# Patient Record
Sex: Female | Born: 1960 | Race: White | Hispanic: No | Marital: Single
Health system: Southern US, Community
[De-identification: ages and names within clinical notes are randomized; demographics above are authoritative.]

---

## 2021-05-03 ENCOUNTER — Emergency Department (HOSPITAL_COMMUNITY): Payer: 59

## 2021-05-03 ENCOUNTER — Other Ambulatory Visit: Payer: Self-pay

## 2021-05-03 ENCOUNTER — Emergency Department (HOSPITAL_COMMUNITY)
Admission: EM | Admit: 2021-05-03 | Discharge: 2021-05-03 | Disposition: A | Discharge status: Home / Self Care | Payer: 59 | Attending: Emergency Medicine | Admitting: Emergency Medicine

## 2021-05-03 ENCOUNTER — Encounter (HOSPITAL_COMMUNITY): Payer: Self-pay | Admitting: *Deleted

## 2021-05-03 DIAGNOSIS — R1012 Left upper quadrant pain: Secondary | ICD-10-CM | POA: Diagnosis present

## 2021-05-03 DIAGNOSIS — R112 Nausea with vomiting, unspecified: Secondary | ICD-10-CM

## 2021-05-03 DIAGNOSIS — K449 Diaphragmatic hernia without obstruction or gangrene: Secondary | ICD-10-CM | POA: Diagnosis not present

## 2021-05-03 DIAGNOSIS — R109 Unspecified abdominal pain: Secondary | ICD-10-CM

## 2021-05-03 LAB — COMPREHENSIVE METABOLIC PANEL
ALT: 37 U/L (ref 0–44)
AST: 27 U/L (ref 15–41)
Albumin: 4.1 g/dL (ref 3.5–5.0)
Alkaline Phosphatase: 79 U/L (ref 38–126)
Anion gap: 7 (ref 5–15)
BUN: 7 mg/dL (ref 6–20)
CO2: 28 mmol/L (ref 22–32)
Calcium: 9.4 mg/dL (ref 8.9–10.3)
Chloride: 105 mmol/L (ref 98–111)
Creatinine, Ser: 0.81 mg/dL (ref 0.44–1.00)
GFR, Estimated: >60 mL/min (ref >60)
Glucose, Bld: 113 mg/dL — ABNORMAL HIGH (ref 70–99)
Potassium: 3.8 mmol/L (ref 3.5–5.1)
Sodium: 140 mmol/L (ref 135–145)
Total Bilirubin: 0.5 mg/dL (ref 0.3–1.2)
Total Protein: 7.4 g/dL (ref 6.5–8.1)

## 2021-05-03 LAB — CBC WITH DIFFERENTIAL/PLATELET
Abs Immature Granulocytes: 0.02 10*3/uL (ref 0.00–0.07)
Basophils Absolute: 0 10*3/uL (ref 0.0–0.1)
Basophils Relative: 0 %
Eosinophils Absolute: 0.1 10*3/uL (ref 0.0–0.5)
Eosinophils Relative: 1 %
HCT: 39.6 % (ref 36.0–46.0)
Hemoglobin: 12.5 g/dL (ref 12.0–15.0)
Immature Granulocytes: 1 %
Lymphocytes Relative: 19 %
Lymphs Abs: 0.7 10*3/uL (ref 0.7–4.0)
MCH: 27 pg (ref 26.0–34.0)
MCHC: 31.6 g/dL (ref 30.0–36.0)
MCV: 85.5 fL (ref 80.0–100.0)
Monocytes Absolute: 0.4 10*3/uL (ref 0.1–1.0)
Monocytes Relative: 11 %
Neutro Abs: 2.6 10*3/uL (ref 1.7–7.7)
Neutrophils Relative %: 68 %
Platelets: 189 10*3/uL (ref 150–400)
RBC: 4.63 MIL/uL (ref 3.87–5.11)
RDW: 14 % (ref 11.5–15.5)
WBC: 3.8 10*3/uL — ABNORMAL LOW (ref 4.0–10.5)
nRBC: 0 % (ref 0.0–0.2)

## 2021-05-03 LAB — LIPASE, BLOOD: Lipase: 31 U/L (ref 11–51)

## 2021-05-03 MED ORDER — ONDANSETRON HCL 4 MG/2ML IJ SOLN
4.0000 mg | Freq: Once | INTRAMUSCULAR | Status: AC
  Administered 2021-05-03: 4 mg via INTRAVENOUS
  Filled 2021-05-03: qty 2

## 2021-05-03 MED ORDER — DICYCLOMINE HCL 20 MG PO TABS: 20.0000 mg | ORAL_TABLET | Freq: Two times a day (BID) | ORAL | 0 refills | Status: AC | PRN

## 2021-05-03 MED ORDER — ONDANSETRON HCL 4 MG PO TABS: 4.0000 mg | ORAL_TABLET | Freq: Three times a day (TID) | ORAL | 0 refills | Status: AC | PRN

## 2021-05-03 MED ORDER — MORPHINE SULFATE (PF) 4 MG/ML IV SOLN: 4.0000 mg | Freq: Once | INTRAVENOUS | Status: DC

## 2021-05-03 MED ORDER — FAMOTIDINE 20 MG PO TABS: 20.0000 mg | ORAL_TABLET | Freq: Every day | ORAL | 0 refills | Status: AC

## 2021-05-03 NOTE — Discharge Instructions (Addendum)
Take Pepcid daily to decrease stomach acid and help with your symptoms. You Zofran as needed for nausea or vomiting. Use Bentyl as needed for abdominal spasm or pain. Continue taking Tylenol ibuprofen as needed for pain. If you continue to have symptoms, I recommend you follow-up with your GI doctor for further evaluation.  Return to emergency room if you develop fevers, persistent vomiting, severe worsening pain, or any new, worsening, or concerning symptoms.

## 2021-05-03 NOTE — ED Provider Notes (Signed)
[facility]-EMERGENCY DEPT Provider Note   CSN: [id number] Arrival date & time: 05/03/21  0818     History Chief Complaint  Patient presents with   Abdominal Cramping   Nausea    Samantha Hale is a 60 y.o. female presenting for evaluation of nausea, vomiting, abdominal pain.  Patient states pain is been present for the past couple months, but worse in the past 24 hours.  She has associated nausea with intermittent vomiting.  Pain is constant, with waxing and waning spasm pain.  It is mostly in the left side of her abdomen and radiates to her back.  She reports a history of diverticulitis, and states 2 weeks ago she called her PCP who called in Cipro and Flagyl.  She completed the course of antibiotics about a week ago, but reports no improvement of symptoms.  She denies fevers, chills, chest pain, shortness breath, cough, abnormal urination or bowel movements.  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  She is taking ibuprofen for pain.  She states that she has been traveling between [place] and [place] to help her mom who has dementia, has had increased stress.  Initially, patient was doing packing and lots of heavy lifting, but has not been doing this for several weeks.  Nothing makes her pain better or worse, including positioning or p.o. intake.  No change in appetite or diet recently.  HPI     History reviewed. No pertinent past medical history.  There are no problems to display for this patient.   History reviewed. No pertinent surgical history.   OB History   No obstetric history on file.     No family history on file.     Home Medications Prior to Admission medications   Medication Sig Start Date End Date Taking? Authorizing Provider  dicyclomine (BENTYL) 20 MG tablet Take 1 tablet (20 mg total) by mouth 2 (two) times daily as needed for spasms. 05/03/21  Yes Charmion Hapke, PA-C  famotidine (PEPCID) 20 MG  tablet Take 1 tablet (20 mg total) by mouth daily. 05/03/21  Yes Kurt Azimi, PA-C  ondansetron (ZOFRAN) 4 MG tablet Take 1 tablet (4 mg total) by mouth every 8 (eight) hours as needed for nausea or vomiting. 05/03/21  Yes Naia Ruff, PA-C    Allergies    Patient has no allergy information on record.  Review of Systems   Review of Systems  Gastrointestinal:  Positive for abdominal pain, nausea and vomiting.  Genitourinary:  Positive for flank pain.  All other systems reviewed and are negative.  Physical Exam Updated Vital Signs BP 128/81   Pulse (!) 57   Temp 98.7 F (37.1 C) (Oral)   Resp 18   Ht 5\' 7"  (1.702 m)   Wt 86.2 kg   SpO2 98%   BMI 29.76 kg/m   Physical Exam Vitals and nursing note reviewed.  Constitutional:      General: She is not in acute distress.    Appearance: Normal appearance.     Comments: Nontoxic  HENT:     Head: Normocephalic and atraumatic.  Eyes:     Conjunctiva/sclera: Conjunctivae normal.     Pupils: Pupils are equal, round, and reactive to light.  Cardiovascular:     Rate and Rhythm: Normal rate and regular rhythm.     Pulses: Normal pulses.  Pulmonary:     Effort: Pulmonary effort is normal. No respiratory distress.     Breath sounds: Normal  breath sounds. No wheezing.     Comments: Speaking in full sentences.  Clear lung sounds in all fields. Abdominal:     General: There is no distension.     Palpations: Abdomen is soft. There is no mass.     Tenderness: There is abdominal tenderness. There is left CVA tenderness. There is no guarding or rebound.     Comments: Ttp of L sided abd, mostly LUQ. L sided flank pain.   Musculoskeletal:        General: Normal range of motion.     Cervical back: Normal range of motion and neck supple.  Skin:    General: Skin is warm and dry.     Capillary Refill: Capillary refill takes less than 2 seconds.  Neurological:     Mental Status: She is alert and oriented to person, place, and time.   Psychiatric:        Mood and Affect: Mood and affect normal.        Speech: Speech normal.        Behavior: Behavior normal.    ED Results / Procedures / Treatments   Labs (all labs ordered are listed, but only abnormal results are displayed) Labs Reviewed  COMPREHENSIVE METABOLIC PANEL - Abnormal; Notable for the following components:      Result Value   Glucose, Bld 113 (*)    All other components within normal limits  CBC WITH DIFFERENTIAL/PLATELET - Abnormal; Notable for the following components:   WBC 3.8 (*)    All other components within normal limits  LIPASE, BLOOD  URINALYSIS, ROUTINE W REFLEX MICROSCOPIC    EKG None  Radiology CT Renal Stone Study  Result Date: 05/03/2021 CLINICAL DATA:  60 year old female with left side flank and back pain for 3 months. Nausea. EXAM: CT ABDOMEN AND PELVIS WITHOUT CONTRAST TECHNIQUE: Multidetector CT imaging of the abdomen and pelvis was performed following the standard protocol without IV contrast. COMPARISON:  None. FINDINGS: Lower chest: Small to moderate gastric hiatal hernia. Otherwise negative lower chest. Hepatobiliary: Absent gallbladder.  Negative noncontrast liver. Pancreas: Negative aside from partial fatty atrophy of the pancreas. Spleen: Spleen size is at the upper limits of normal. Otherwise negative noncontrast appearance. Adrenals/Urinary Tract: Normal adrenal glands. Punctate right lower pole nephrolithiasis *CRASH* that punctate right midpole nephrolithiasis on coronal image 73. But no hydronephrosis. No left-side nephrolithiasis. No perinephric stranding. Both ureters are decompressed. There are numerous pelvic phleboliths, but no secondary signs of a distal ureteral calculus. Unremarkable bladder. Stomach/Bowel: Redundant large bowel, but otherwise negative. Normal appendix visible on coronal image 58. Negative terminal ileum. No dilated small bowel. 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 The bypassed stomach and duodenum are diminutive. No free air, free fluid, mesenteric inflammation. Vascular/Lymphatic: Normal caliber abdominal aorta. Mild Aortoiliac calcified atherosclerosis. Reproductive: Noncontrast uterus and right ovary appear negative. Left ovary may be diminutive or absent. Other: No pelvic free fluid. Musculoskeletal: Intermittent advanced disc and endplate degeneration in the spine, including T12-L1 and L3-L4. Mild degenerative retrolisthesis at the latter. No acute osseous abnormality identified. IMPRESSION: 1. Punctate right nephrolithiasis, but no left-side calculus or obstructive uropathy. 2. Small to moderate gastric hiatal hernia. 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 3. Intermittent advanced spinal disc degeneration. Electronically Signed   By: Odessa Fleming M.D.   On: 05/03/2021 10:55    Procedures Procedures   Medications Ordered in ED Medications  morphine 4  MG/ML injection 4 mg (4 mg Intravenous Not Given 05/03/21 1051)  ondansetron (ZOFRAN) injection 4 mg (4 mg Intravenous Given 05/03/21 1008)    ED Course  I have reviewed the triage vital signs and the nursing notes.  Pertinent labs & imaging results that were available during my care of the patient were reviewed by me and considered in my medical decision making (see chart for details).    MDM Rules/Calculators/A&P                          Patient presenting for evaluation nausea, vomiting, abdominal pain.  On exam, patient peers nontoxic.  She does have tenderness palpation the left side abdomen with left flank pain.  Less likely diverticulitis considering length of symptoms, however due to patient's history, will obtain labs and CT.  Also consider kidney stone.  Less likely pyelo without urinary symptoms.  Consider MSK cause.  Will treat symptomatically and reassess.  Labs interpreted by me, overall reassuring.  Very mild  leukopenia of 3.8, nonspecific.  Kidney, liver, pancreatic function normal.  CT shows kidney stone in the right kidney, but no ureterolithiasis.  No hydro to indicate recently passed stone on the left.  Patient does have a small to moderate sized hiatal hernia, which could be contributing to her symptoms.  Discussed findings with patient.  Discussed symptomatic management with Pepcid, Zofran as needed, and Bentyl.  Patient reports nausea is improved after medication, as she was not having any pain at the time she had refused morphine.  I discussed follow-up with her GI doctor for further evaluation and management.  At this time, patient appears safe for discharge.  Return precautions given.  Patient states she understands and agrees to plan  Final Clinical Impression(s) / ED Diagnoses Final diagnoses:  Hiatal hernia  Non-intractable vomiting with nausea, unspecified vomiting type  Abdominal spasms    Rx / DC Orders ED Discharge Orders          Ordered    famotidine (PEPCID) 20 MG tablet  Daily        05/03/21 1125    ondansetron (ZOFRAN) 4 MG tablet  Every 8 hours PRN        05/03/21 1125    dicyclomine (BENTYL) 20 MG tablet  2 times daily PRN        05/03/21 1125             [place] Delauder, PA-C 05/03/21 1135    Bethann Berkshire, MD 05/05/21 0845

## 2021-05-03 NOTE — ED Notes (Signed)
Pt declining pain medication at this time.

## 2021-05-03 NOTE — ED Notes (Signed)
Pt back from CT at this time 

## 2021-05-03 NOTE — ED Provider Notes (Signed)
Emergency Medicine Provider Triage Evaluation Note  Samantha Hale , a 60 y.o. female  was evaluated in triage.  Pt complains of left upper quadrant abdominal pain that sometimes radiates to the left back for the last 2 months.  Became more severe last night and had nausea, dry heaving.  History of diverticulitis.  Her doctor called in antibiotics 3 weeks ago but this did not help.  History of kidney stones  Review of Systems  Positive: Abdominal pain, nausea Negative: Fever, vomiting, diarrhea, dysuria, hematuria  Physical Exam  BP (!) 145/86 (BP Location: Left Arm)   Pulse 68   Temp 98 F (36.7 C) (Oral)   Resp 18   SpO2 100%  Gen:   Awake, no distress  Resp:  Normal effort MSK:   Moves extremities without difficulty Abd:  Mild LUQ and CVA tenderness (distractable)  Medical Decision Making  Medically screening exam initiated at 9:22 AM.  Appropriate orders placed.   Patient made aware this encounter is a triage and screening encounter and no beds are immediately available at this time in the ER.  Patient was informed that the remainder of the evaluation will be completed by another provider.  Patient made aware triage orders have been placed and patient will be placed in the waiting room while work up is initiated and until a room becomes available. Patient encouraged to await a formal ER encounter with a clinician.  Patient made aware that exiting the department prior to formal encounter with an ER clinician and completion of the work-up is considered leaving against medical advice.  At that time there is no guarantee that there are no emergency medical conditions present and patient assumes risks of leaving including worsening condition, permanent disability and death. Patient verbalizes understanding.     Liberty Handy, PA-C 05/03/21 1950    Bethann Berkshire, MD 05/05/21 [phone]

## 2021-05-03 NOTE — ED Triage Notes (Signed)
Pt complains of left side abdominal/back cramps, nausea/emesis, headache x 2 months. Hx of diverticulitis, tried taking abx from PCP w/o relief.

## 2021-05-11 ENCOUNTER — Encounter: Payer: Self-pay | Admitting: Nurse Practitioner

## 2021-06-09 ENCOUNTER — Ambulatory Visit: Payer: 59 | Admitting: Nurse Practitioner

## 2022-10-04 IMAGING — CT CT RENAL STONE PROTOCOL
2 of 4 series · 16 of 46 positions shown, 18 images · non-contrast
Comparison: None.

CLINICAL DATA: 59-year-old female with left side flank and back
pain for 3 months. Nausea.

EXAM:
CT ABDOMEN AND PELVIS WITHOUT CONTRAST
TECHNIQUE: Multidetector CT imaging of the abdomen and pelvis was performed
following the standard protocol without IV contrast.

[Series 2: axial st · axial · 0.75mm/px · z∈[-534,-104]mm · 13 of 98 slices shown, 15 images]
[im 6/98  soft-tissue]
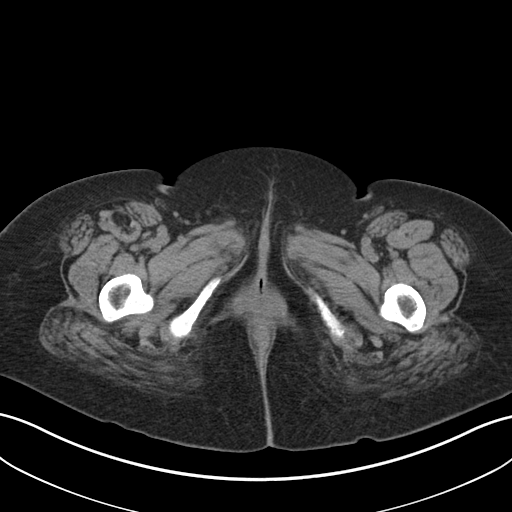
[im 6/98  bone]
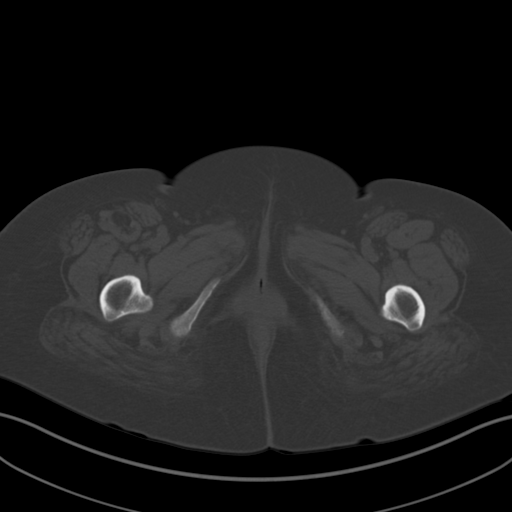
[im 11/98  soft-tissue]
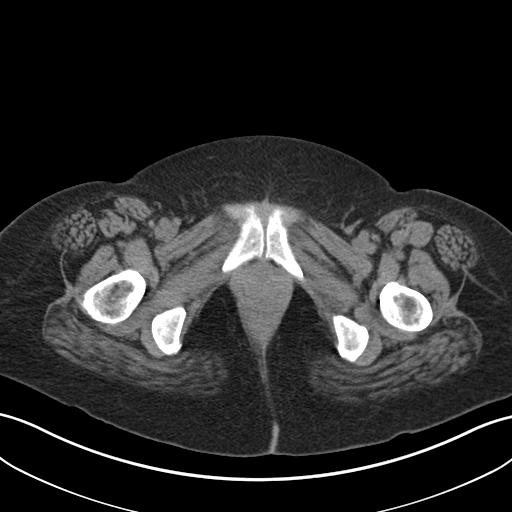
[im 22/98  soft-tissue]
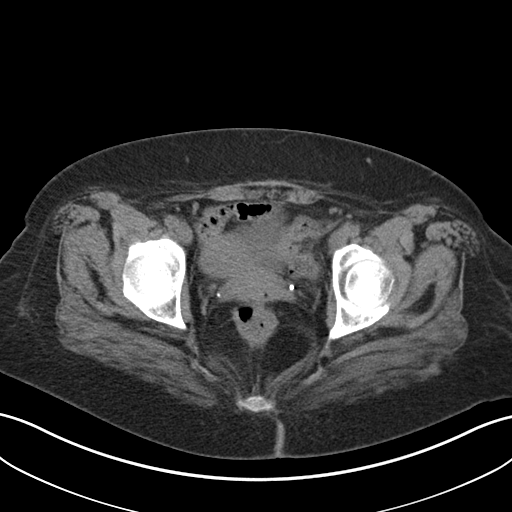
[im 27/98  soft-tissue]
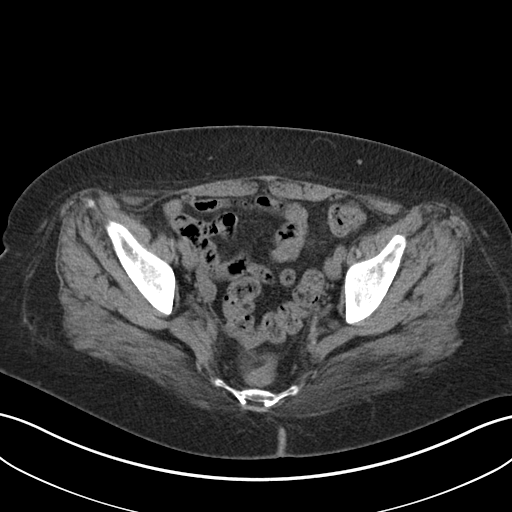
[im 33/98  soft-tissue]
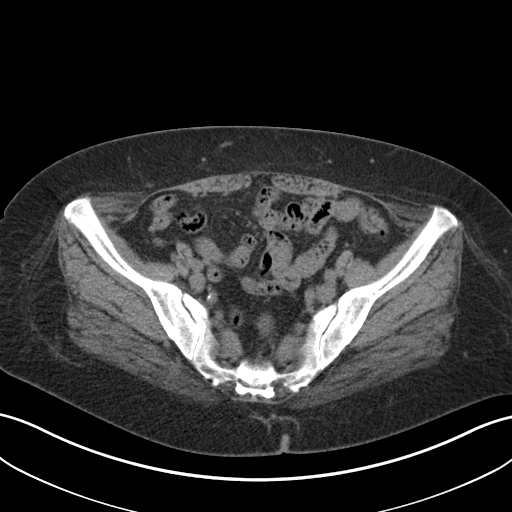
[im 44/98  soft-tissue]
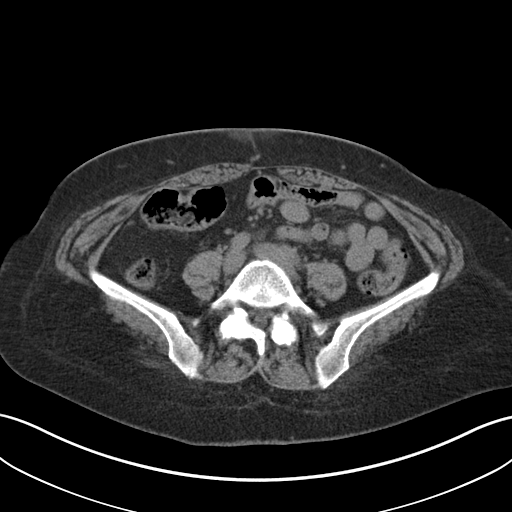
[im 49/98  soft-tissue]
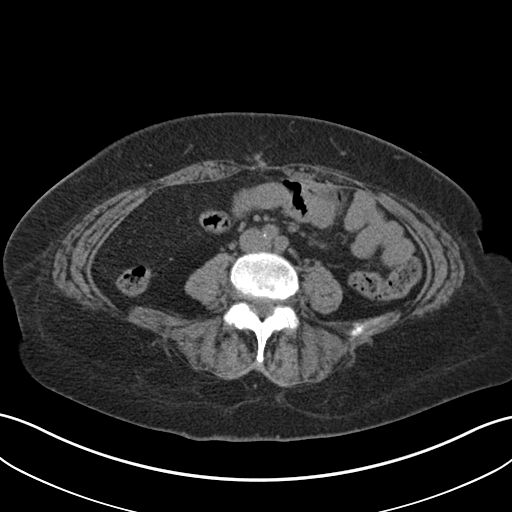
[im 54/98  soft-tissue]
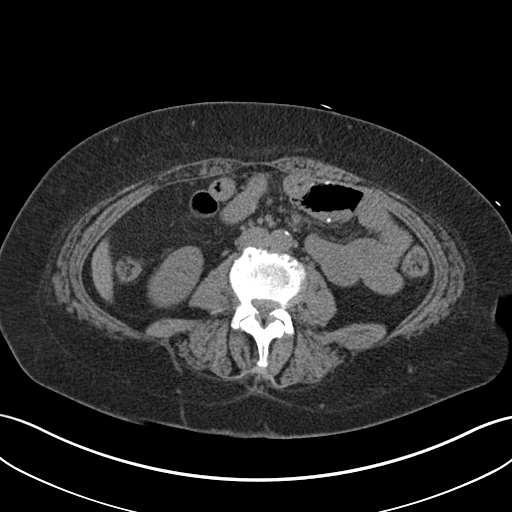
[im 65/98  soft-tissue]
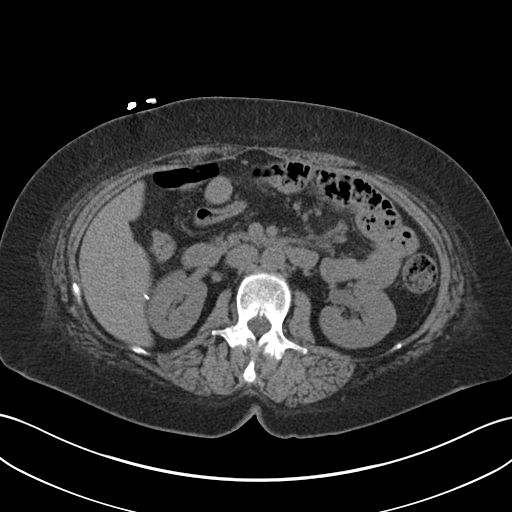
[im 65/98  bone]
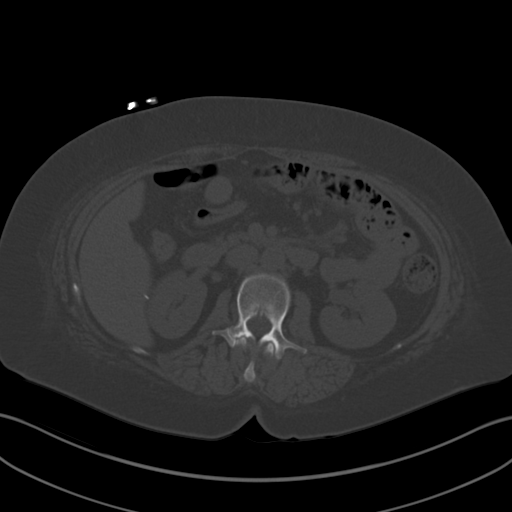
[im 71/98  soft-tissue]
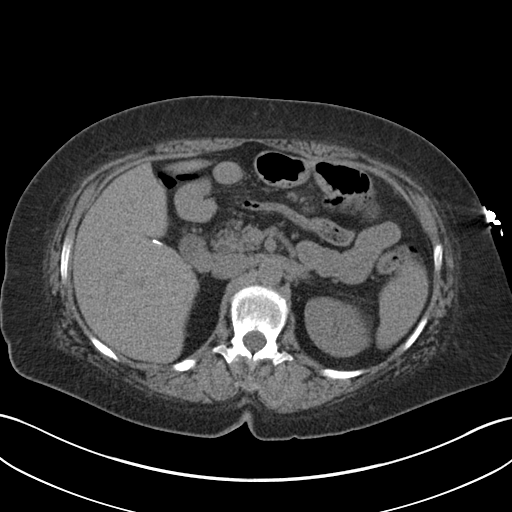
[im 76/98  soft-tissue]
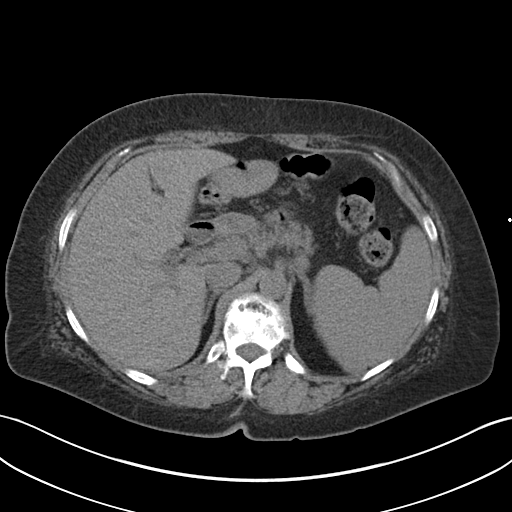
[im 87/98  soft-tissue]
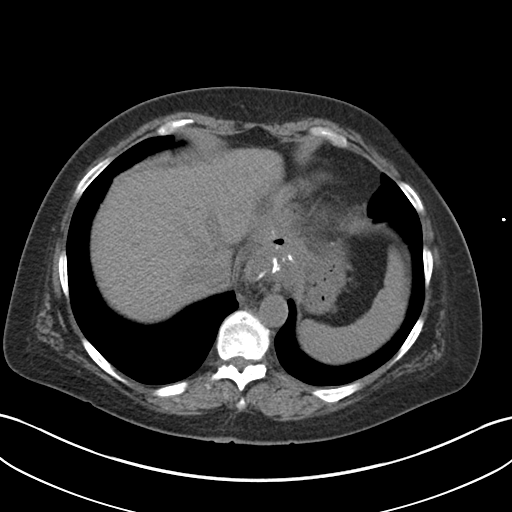
[im 92/98  soft-tissue]
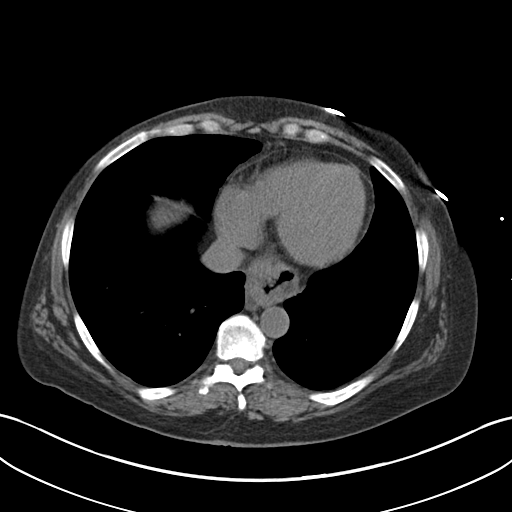

[Series 5: coronal · coronal · 0.87mm/px · 3 of 140 slices shown]
[im 47/140  soft-tissue]
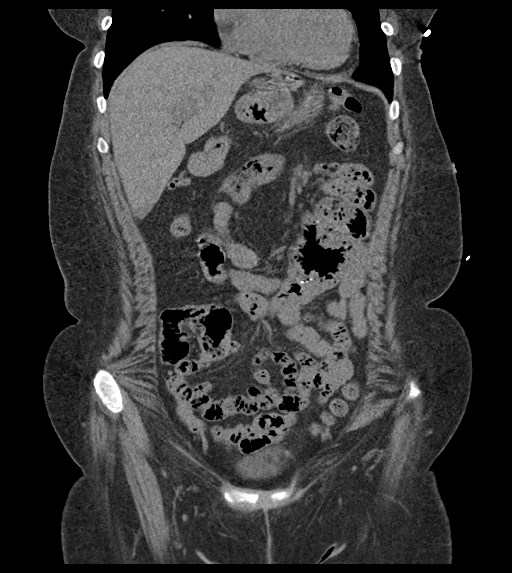
[im 62/140  soft-tissue]
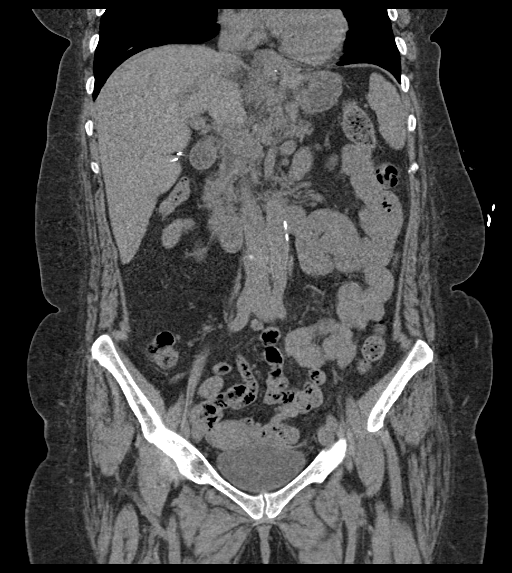
[im 78/140  soft-tissue]
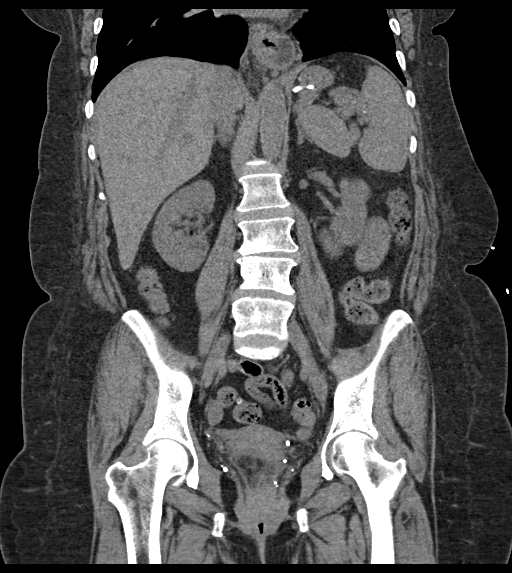

[16 of 46 positions shown; findings below may reference images not displayed]

FINDINGS: Lower chest: Small to moderate gastric hiatal hernia. Otherwise
negative lower chest.

Hepatobiliary: Absent gallbladder.  Negative noncontrast liver.

Pancreas: Negative aside from partial fatty atrophy of the pancreas.

Spleen: Spleen size is at the upper limits of normal. Otherwise
negative noncontrast appearance.

Adrenals/Urinary Tract: Normal adrenal glands.

Punctate right lower pole nephrolithiasis *CRASH* that punctate
right midpole nephrolithiasis on coronal image 73. But no
hydronephrosis. No left-side nephrolithiasis. No perinephric
stranding. Both ureters are decompressed. There are numerous pelvic
phleboliths, but no secondary signs of a distal ureteral calculus.
Unremarkable bladder.

Stomach/Bowel: Redundant large bowel, but otherwise negative. Normal
appendix visible on coronal image 58. Negative terminal ileum. No
dilated small bowel.

Sequelae of Roux-en-Y type gastric bypass with gastrojejunostomy and
downstream small bowel anastomosis (series 2, image 42). The
bypassed stomach and duodenum are diminutive. No free air, free
fluid, mesenteric inflammation.

Vascular/Lymphatic: Normal caliber abdominal aorta. Mild Aortoiliac
calcified atherosclerosis.

Reproductive: Noncontrast uterus and right ovary appear negative.
Left ovary may be diminutive or absent.

Other: No pelvic free fluid.

Musculoskeletal: Intermittent advanced disc and endplate
degeneration in the spine, including T12-L1 and L3-L4. Mild
degenerative retrolisthesis at the latter. No acute osseous
abnormality identified.
IMPRESSION: 1. Punctate right nephrolithiasis, but no left-side calculus or
obstructive uropathy.
2. Small to moderate gastric hiatal hernia. Sequelae of Roux-en-Y
type gastric bypass with no adverse features.
3. Intermittent advanced spinal disc degeneration.
